# Patient Record
Sex: Female | Born: 1995 | Race: Black or African American | Hispanic: No | Marital: Single | Smoking: Former smoker
Health system: Southern US, Community
[De-identification: ages and names within clinical notes are randomized; demographics above are authoritative.]

## PROBLEM LIST (undated history)

## (undated) DIAGNOSIS — Z789 Other specified health status: Secondary | ICD-10-CM

## (undated) HISTORY — PX: NO PAST SURGERIES: SHX2092

---

## 2019-07-31 ENCOUNTER — Other Ambulatory Visit: Payer: Self-pay

## 2019-07-31 ENCOUNTER — Emergency Department (HOSPITAL_COMMUNITY)
Admission: EM | Admit: 2019-07-31 | Discharge: 2019-07-31 | Disposition: A | Discharge status: Home / Self Care | Payer: Medicaid Other | Attending: Emergency Medicine | Admitting: Emergency Medicine

## 2019-07-31 ENCOUNTER — Encounter (HOSPITAL_COMMUNITY): Payer: Self-pay | Admitting: Emergency Medicine

## 2019-07-31 ENCOUNTER — Emergency Department (HOSPITAL_COMMUNITY): Payer: Medicaid Other

## 2019-07-31 DIAGNOSIS — S9032XA Contusion of left foot, initial encounter: Secondary | ICD-10-CM | POA: Diagnosis not present

## 2019-07-31 DIAGNOSIS — Y998 Other external cause status: Secondary | ICD-10-CM | POA: Insufficient documentation

## 2019-07-31 DIAGNOSIS — Y9301 Activity, walking, marching and hiking: Secondary | ICD-10-CM | POA: Diagnosis not present

## 2019-07-31 DIAGNOSIS — X509XXA Other and unspecified overexertion or strenuous movements or postures, initial encounter: Secondary | ICD-10-CM | POA: Diagnosis not present

## 2019-07-31 DIAGNOSIS — Y92018 Other place in single-family (private) house as the place of occurrence of the external cause: Secondary | ICD-10-CM | POA: Insufficient documentation

## 2019-07-31 DIAGNOSIS — S99922A Unspecified injury of left foot, initial encounter: Secondary | ICD-10-CM | POA: Diagnosis present

## 2019-07-31 MED ORDER — IBUPROFEN 400 MG PO TABS
600.0000 mg | ORAL_TABLET | Freq: Once | ORAL | Status: AC
  Administered 2019-07-31: 600 mg via ORAL
  Filled 2019-07-31: qty 1

## 2019-07-31 MED ORDER — IBUPROFEN 600 MG PO TABS
600.0000 mg | ORAL_TABLET | Freq: Three times a day (TID) | ORAL | 0 refills | Status: DC | PRN
Start: 2019-07-31 — End: 2019-08-10

## 2019-07-31 MED ORDER — IBUPROFEN 600 MG PO TABS
600.0000 mg | ORAL_TABLET | Freq: Three times a day (TID) | ORAL | 0 refills | Status: DC | PRN
Start: 2019-07-31 — End: 2019-07-31

## 2019-07-31 NOTE — ED Notes (Signed)
Patient given discharge instructions. Questions were answered. Patient verbalized understanding of discharge instructions and care at home.  

## 2019-07-31 NOTE — ED Provider Notes (Addendum)
[facility] EMERGENCY DEPARTMENT Provider Note   CSN: [id number] Arrival date & time: 07/31/19  1453     History Chief Complaint  Patient presents with  . Foot Pain    Teresa Boyle is a 24 y.o. female.  The history is provided by the patient. No language interpreter was used.  Foot Pain   Teresa Boyle is a 24 y.o. female who presents to the Emergency Department complaining of foot injury. She presents the emergency department after injuring her left foot when she was walking down the stairs yesterday. She missed her stop and landed hard on her left foot. She complains of pain to the lateral aspect of her left distal foot. Pain is present at rest and significantly worse when she puts pressure on the outer portion of her foot. No prior similar symptoms. She has no medical problems and takes no medications. Symptoms are mild to moderate and constant nature.    History reviewed. No pertinent past medical history.  There are no problems to display for this patient.   History reviewed. No pertinent surgical history.     No family history on file.  Social History   Tobacco Use  . Smoking status: Never Smoker  Substance Use Topics  . Alcohol use: Yes  . Drug use: Never    Home Medications Prior to Admission medications   Not on File    Allergies    Patient has no known allergies.  Review of Systems   Review of Systems  All other systems reviewed and are negative.   Physical Exam Updated Vital Signs BP (!) 141/94 (BP Location: Right Arm)   Pulse 80   Temp 97.6 F (36.4 C) (Oral)   Resp 18   Ht 5\' 6"  (1.676 m)   Wt 86.2 kg   SpO2 100%   BMI 30.67 kg/m   Physical Exam Vitals and nursing note reviewed.  Constitutional:      Appearance: He is well-developed.  HENT:     Head: Normocephalic and atraumatic.  Cardiovascular:     Rate and Rhythm: Normal rate and regular rhythm.  Pulmonary:     Effort: Pulmonary effort is normal. No  respiratory distress.  Musculoskeletal:     Comments: 2+ DP pulses bilaterally. There is mild soft tissue swelling and tenderness to the left lateral foot. Flexion extension is intact the ankle. Range of motion intact in the toes.  Skin:    General: Skin is warm and dry.  Neurological:     Mental Status: He is alert and oriented to person, place, and time.  Psychiatric:        Behavior: Behavior normal.     ED Results / Procedures / Treatments   Labs (all labs ordered are listed, but only abnormal results are displayed) Labs Reviewed - No data to display  EKG None  Radiology DG Foot Complete Left  Result Date: 07/31/2019 CLINICAL DATA:  Tripped on stairs leading to fall. Pain and swelling of left foot. EXAM: LEFT FOOT - COMPLETE 3+ VIEW COMPARISON:  None. FINDINGS: There is no evidence of fracture or dislocation. There is no evidence of arthropathy or other focal bone abnormality. Mild generalized soft tissue edema. IMPRESSION: No fracture or subluxation of the left foot. Electronically Signed   By: Keith Rake M.D.   On: 07/31/2019 16:07    Procedures Procedures (including critical care time)  Medications Ordered in ED Medications  ibuprofen (ADVIL) tablet 600 mg (has no administration in time range)  ED Course  I have reviewed the triage vital signs and the nursing notes.  Pertinent labs & imaging results that were available during my care of the patient were reviewed by me and considered in my medical decision making (see chart for details).    MDM Rules/Calculators/A&P                     Patient here for evaluation of injury to the left foot. She does have local tenderness and swelling on examination. Imaging is negative for acute fracture. Presentation is not consistent with lisfranc injury, Jones fracture.  Plain films personally reviewed.  Discussed with patient home care for foot contusion/sprain. Will treat with postop shoe and crutches with weight-bearing  as tolerated. Recommend over-the-counter ibuprofen for pain control as well as elevation. Discussed return precautions.  Final Clinical Impression(s) / ED Diagnoses Final diagnoses:  Contusion of left foot, initial encounter    Rx / DC Orders ED Discharge Orders    None       Tilden Fossa, MD 07/31/19 1630    Tilden Fossa, MD 07/31/19 [phone]

## 2019-07-31 NOTE — ED Triage Notes (Signed)
Pt c/o left foot pain after a fall last night.

## 2019-07-31 NOTE — Progress Notes (Signed)
Orthopedic Tech Progress Note Patient Details:  Teresa Boyle 08-25-1995 [id number]  Ortho Devices Type of Ortho Device: Crutches, Postop shoe/boot Ortho Device/Splint Interventions: Application   Post Interventions Patient Tolerated: Well Instructions Provided: Adjustment of device, Care of device, Poper ambulation with device   Gwendolyn Lima 07/31/2019, 4:45 PM

## 2019-08-10 ENCOUNTER — Encounter (HOSPITAL_COMMUNITY): Payer: Self-pay | Admitting: *Deleted

## 2019-08-10 ENCOUNTER — Inpatient Hospital Stay (HOSPITAL_COMMUNITY)
Admission: AD | Admit: 2019-08-10 | Discharge: 2019-08-10 | Disposition: A | Discharge status: Home / Self Care | Payer: Medicaid Other | Attending: Obstetrics & Gynecology | Admitting: Obstetrics & Gynecology

## 2019-08-10 ENCOUNTER — Inpatient Hospital Stay (HOSPITAL_COMMUNITY): Payer: Medicaid Other

## 2019-08-10 ENCOUNTER — Ambulatory Visit (HOSPITAL_COMMUNITY)
Admission: EM | Admit: 2019-08-10 | Discharge: 2019-08-10 | Disposition: A | Discharge status: Home / Self Care | Payer: Medicaid Other

## 2019-08-10 DIAGNOSIS — O3680X0 Pregnancy with inconclusive fetal viability, not applicable or unspecified: Secondary | ICD-10-CM | POA: Diagnosis not present

## 2019-08-10 DIAGNOSIS — O26891 Other specified pregnancy related conditions, first trimester: Secondary | ICD-10-CM | POA: Diagnosis present

## 2019-08-10 DIAGNOSIS — R1030 Lower abdominal pain, unspecified: Secondary | ICD-10-CM | POA: Diagnosis not present

## 2019-08-10 DIAGNOSIS — Z3A01 Less than 8 weeks gestation of pregnancy: Secondary | ICD-10-CM | POA: Diagnosis not present

## 2019-08-10 DIAGNOSIS — R109 Unspecified abdominal pain: Secondary | ICD-10-CM

## 2019-08-10 HISTORY — DX: Other specified health status: Z78.9

## 2019-08-10 LAB — WET PREP, GENITAL
Sperm: NONE SEEN
Trich, Wet Prep: NONE SEEN
Yeast Wet Prep HPF POC: NONE SEEN

## 2019-08-10 LAB — URINALYSIS, ROUTINE W REFLEX MICROSCOPIC
Bilirubin Urine: NEGATIVE
Glucose, UA: NEGATIVE mg/dL
Hgb urine dipstick: NEGATIVE
Ketones, ur: NEGATIVE mg/dL
Leukocytes,Ua: NEGATIVE
Nitrite: NEGATIVE
Protein, ur: NEGATIVE mg/dL
Specific Gravity, Urine: 1.026 (ref 1.005–1.030)
pH: 5 (ref 5.0–8.0)

## 2019-08-10 LAB — CBC
HCT: 35.3 % — ABNORMAL LOW (ref 36.0–46.0)
Hemoglobin: 11.2 g/dL — ABNORMAL LOW (ref 12.0–15.0)
MCH: 22.3 pg — ABNORMAL LOW (ref 26.0–34.0)
MCHC: 31.7 g/dL (ref 30.0–36.0)
MCV: 70.3 fL — ABNORMAL LOW (ref 80.0–100.0)
Platelets: 368 10*3/uL (ref 150–400)
RBC: 5.02 MIL/uL (ref 3.87–5.11)
RDW: 15.3 % (ref 11.5–15.5)
WBC: 7.3 10*3/uL (ref 4.0–10.5)
nRBC: 0 % (ref 0.0–0.2)

## 2019-08-10 LAB — POCT PREGNANCY, URINE: Preg Test, Ur: POSITIVE — AB

## 2019-08-10 LAB — ABO/RH: ABO/RH(D): O POS

## 2019-08-10 LAB — HCG, QUANTITATIVE, PREGNANCY: hCG, Beta Chain, Quant, S: 1413 m[IU]/mL — ABNORMAL HIGH (ref <5)

## 2019-08-10 NOTE — MAU Note (Signed)
Did home upt and it came back positive. Has occ abd cramping but no pain now. No bleeding or vag d/c. Want to be sure I am really pregnant and how far along I am.

## 2019-08-10 NOTE — Progress Notes (Signed)
Discussed pt's Triage information with V SMith CNM. Pt sitting in Triage for now where provider will see her and discuss POC

## 2019-08-10 NOTE — MAU Note (Signed)
Judeth Horn NP in Triage to see pt

## 2019-08-10 NOTE — Progress Notes (Signed)
Judeth Horn NP in earlier to discuss test results and d/c plan. Written and verbal d/c instructions given and understanding voiced. Will return to MAU Sat for repeat BHCG

## 2019-08-10 NOTE — MAU Provider Note (Signed)
Chief Complaint: Abdominal Pain   First Provider Initiated Contact with Patient 08/10/19 1951     SUBJECTIVE HPI: Teresa Boyle is a 24 y.o. G1P0 at [redacted]w[redacted]d who presents to Maternity Admissions reporting abdominal cramping.  Symptoms started 4 days ago. Reports mid lower abdominal cramping that comes & goes. Denies n/v/d, dysuria, vaginal bleeding, or vaginal discharge. Had a positive pregnancy test yesterday.   Location: mid lower abdomen Quality: intermittent Severity: currently none/10 on pain scale Duration: 4 days Timing: intermittent Modifying factors: none Associated signs and symptoms: none  Past Medical History:  Diagnosis Date  . Medical history non-contributory    OB History  Gravida Para Term Preterm AB Living  1            SAB TAB Ectopic Multiple Live Births               # Outcome Date GA Lbr Len/2nd Weight Sex Delivery Anes PTL Lv  1 Current            Past Surgical History:  Procedure Laterality Date  . NO PAST SURGERIES     Social History   Socioeconomic History  . Marital status: Single    Spouse name: Not on file  . Number of children: Not on file  . Years of education: Not on file  . Highest education level: Not on file  Occupational History  . Not on file  Tobacco Use  . Smoking status: Never Smoker  . Smokeless tobacco: Never Used  Substance and Sexual Activity  . Alcohol use: Yes    Comment: on occ  . Drug use: Never  . Sexual activity: Yes  Other Topics Concern  . Not on file  Social History Narrative  . Not on file   Social Determinants of Health   Financial Resource Strain:   . Difficulty of Paying Living Expenses:   Food Insecurity:   . Worried About Programme researcher, broadcasting/film/video in the Last Year:   . Barista in the Last Year:   Transportation Needs:   . Freight forwarder (Medical):   Marland Kitchen Lack of Transportation (Non-Medical):   Physical Activity:   . Days of Exercise per Week:   . Minutes of Exercise per Session:    Stress:   . Feeling of Stress :   Social Connections:   . Frequency of Communication with Friends and Family:   . Frequency of Social Gatherings with Friends and Family:   . Attends Religious Services:   . Active Member of Clubs or Organizations:   . Attends Banker Meetings:   Marland Kitchen Marital Status:   Intimate Partner Violence:   . Fear of Current or Ex-Partner:   . Emotionally Abused:   Marland Kitchen Physically Abused:   . Sexually Abused:    Family History  Problem Relation Age of Onset  . Diabetes Father   . Diabetes Paternal Uncle   . Diabetes Paternal Grandmother   . Diabetes Paternal Grandfather    No current facility-administered medications on file prior to encounter.   Current Outpatient Medications on File Prior to Encounter  Medication Sig Dispense Refill  . ibuprofen (ADVIL) 600 MG tablet Take 1 tablet (600 mg total) by mouth every 8 (eight) hours as needed. 20 tablet 0   No Known Allergies  I have reviewed patient's Past Medical Hx, Surgical Hx, Family Hx, Social Hx, medications and allergies.   Review of Systems  Constitutional: Negative.   Gastrointestinal: Positive for abdominal pain.  Negative for constipation, diarrhea, nausea and vomiting.  Genitourinary: Negative.     OBJECTIVE Patient Vitals for the past 24 hrs:  BP Temp Pulse Resp Height Weight  08/10/19 2138 133/85 -- 72 16 -- --  08/10/19 1915 126/79 98.7 F (37.1 C) 63 18 5\' 6"  (1.676 m) 90.3 kg   Constitutional: Well-developed, well-nourished female in no acute distress.  Cardiovascular: normal rate & rhythm, no murmur Respiratory: normal rate and effort. Lung sounds clear throughout GI: Abd soft, non-tender, Pos BS x 4. No guarding or rebound tenderness MS: Extremities nontender, no edema, normal ROM Neurologic: Alert and oriented x 4.      LAB RESULTS Results for orders placed or performed during the hospital encounter of 08/10/19 (from the past 24 hour(s))  Pregnancy, urine POC      Status: Abnormal   Collection Time: 08/10/19  6:50 PM  Result Value Ref Range   Preg Test, Ur POSITIVE (A) NEGATIVE  Urinalysis, Routine w reflex microscopic     Status: None   Collection Time: 08/10/19  6:56 PM  Result Value Ref Range   Color, Urine YELLOW YELLOW   APPearance CLEAR CLEAR   Specific Gravity, Urine 1.026 1.005 - 1.030   pH 5.0 5.0 - 8.0   Glucose, UA NEGATIVE NEGATIVE mg/dL   Hgb urine dipstick NEGATIVE NEGATIVE   Bilirubin Urine NEGATIVE NEGATIVE   Ketones, ur NEGATIVE NEGATIVE mg/dL   Protein, ur NEGATIVE NEGATIVE mg/dL   Nitrite NEGATIVE NEGATIVE   Leukocytes,Ua NEGATIVE NEGATIVE  CBC     Status: Abnormal   Collection Time: 08/10/19  8:04 PM  Result Value Ref Range   WBC 7.3 4.0 - 10.5 K/uL   RBC 5.02 3.87 - 5.11 MIL/uL   Hemoglobin 11.2 (L) 12.0 - 15.0 g/dL   HCT 08/12/19 (L) 71.6 - 96.7 %   MCV 70.3 (L) 80.0 - 100.0 fL   MCH 22.3 (L) 26.0 - 34.0 pg   MCHC 31.7 30.0 - 36.0 g/dL   RDW 89.3 81.0 - 17.5 %   Platelets 368 150 - 400 K/uL   nRBC 0.0 0.0 - 0.2 %  ABO/Rh     Status: None   Collection Time: 08/10/19  8:04 PM  Result Value Ref Range   ABO/RH(D) O POS    No rh immune globuloin      NOT A RH IMMUNE GLOBULIN CANDIDATE, PT RH POSITIVE Performed at [facility], [address], [place], [place]   hCG, quantitative, pregnancy     Status: Abnormal   Collection Time: 08/10/19  8:04 PM  Result Value Ref Range   hCG, Beta Chain, Quant, S 1,413 (H) <5 mIU/mL  Wet prep, genital     Status: Abnormal   Collection Time: 08/10/19  8:46 PM   Specimen: PATH Cytology Cervicovaginal Ancillary Only  Result Value Ref Range   Yeast Wet Prep HPF POC NONE SEEN NONE SEEN   Trich, Wet Prep NONE SEEN NONE SEEN   Clue Cells Wet Prep HPF POC PRESENT (A) NONE SEEN   WBC, Wet Prep HPF POC MODERATE (A) NONE SEEN   Sperm NONE SEEN     IMAGING 08/12/19 OB LESS THAN 14 WEEKS WITH OB TRANSVAGINAL  Result Date: 08/10/2019 CLINICAL DATA:  Initial evaluation for  acute abdominal pain, positive urine pregnancy test. EXAM: OBSTETRIC <14 WK 08/12/2019 AND TRANSVAGINAL OB US TECHNIQUE: Both transabdominal and transvaginal ultrasound examinations were performed for complete evaluation of the gestation as well as the maternal uterus,  adnexal regions, and pelvic cul-de-sac. Transvaginal technique was performed to assess early pregnancy. COMPARISON:  None. FINDINGS: Intrauterine gestational sac: Negative. Endometrial stripe measures 10 mm in thickness. Yolk sac:  Negative. Embryo:  Negative. Cardiac Activity: Negative. Heart Rate: N/A  bpm Subchorionic hemorrhage:  None visualized. Maternal uterus/adnexae: Ovaries are within normal limits bilaterally. Degenerating corpus luteal cyst noted within the left ovary. No adnexal mass or free fluid. IMPRESSION: 1. Early pregnancy with no discrete IUP or adnexal mass identified. Finding is consistent with a pregnancy of unknown anatomic location. Differential considerations include IUP to early to visualize, recent SAB, or possibly occult ectopic pregnancy. Close clinical monitoring with serial beta HCGs and close interval follow-up ultrasound recommended as clinically warranted. 2. No other acute maternal uterine or adnexal abnormality identified. Electronically Signed   By: Jeannine Boga M.D.   On: 08/10/2019 20:57    MAU COURSE Orders Placed This Encounter  Procedures  . Wet prep, genital  . US OB LESS THAN 14 WEEKS WITH OB TRANSVAGINAL  . Urinalysis, Routine w reflex microscopic  . CBC  . hCG, quantitative, pregnancy  . Pregnancy, urine POC  . ABO/Rh  . Discharge patient   No orders of the defined types were placed in this encounter.   MDM +UPT UA, wet prep, GC/chlamydia, CBC, ABO/Rh, quant hCG, and [place] today to rule out ectopic pregnancy which can be life threatening.   Ultrasound shows no IUP or adnexal masses.  hCG today is 1400.  Will bring back Saturday for stat hCG.  Cannot exclude ectopic pregnancy at this  time.  ASSESSMENT 1. Pregnancy of unknown anatomic location   2. Abdominal pain during pregnancy in first trimester     PLAN Discharge home in stable condition. Return to MAU on Saturday for stat HCG GC/CT pending Reviewed ectopic precautions & reasons to return to MAU   Follow-up Information    [facility] Follow up.   Specialty: Obstetrics and Gynecology Why: return saturday for blood work or sooner for worsening symptoms Contact information: [address] [id number] [place] [phone]         Allergies as of 08/10/2019   No Known Allergies     Medication List    STOP taking these medications   ibuprofen 600 MG tablet Commonly known as: Orest Dikes, NP 08/10/2019  9:51 PM

## 2019-08-10 NOTE — Discharge Instructions (Signed)
Return to care  If you have heavier bleeding that soaks through more that 2 pads per hour for an hour or more If you bleed so much that you feel like you might pass out or you do pass out If you have significant abdominal pain that is not improved with Tylenol   

## 2019-08-12 LAB — GC/CHLAMYDIA PROBE AMP (~~LOC~~) NOT AT ARMC
Chlamydia: NEGATIVE
Comment: NEGATIVE
Comment: NORMAL
Neisseria Gonorrhea: NEGATIVE

## 2019-08-13 ENCOUNTER — Inpatient Hospital Stay (HOSPITAL_COMMUNITY)
Admission: AD | Admit: 2019-08-13 | Discharge: 2019-08-13 | Disposition: A | Discharge status: Home / Self Care | Payer: Medicaid Other | Source: Ambulatory Visit | Attending: Obstetrics and Gynecology | Admitting: Obstetrics and Gynecology

## 2019-08-13 ENCOUNTER — Other Ambulatory Visit: Payer: Self-pay

## 2019-08-13 DIAGNOSIS — O3680X0 Pregnancy with inconclusive fetal viability, not applicable or unspecified: Secondary | ICD-10-CM | POA: Diagnosis present

## 2019-08-13 DIAGNOSIS — Z3A01 Less than 8 weeks gestation of pregnancy: Secondary | ICD-10-CM | POA: Insufficient documentation

## 2019-08-13 LAB — HCG, QUANTITATIVE, PREGNANCY: hCG, Beta Chain, Quant, S: 4800 m[IU]/mL — ABNORMAL HIGH (ref <5)

## 2019-08-13 NOTE — MAU Note (Signed)
Teresa Boyle is a 24 y.o. at [redacted]w[redacted]d here in MAU reporting: here for repeat hcg. No bleeding. Having some pain, states it is the same.  Pain score: 7/10  Vitals:   08/13/19 1448  BP: 131/84  Pulse: 72  Resp: 17  Temp: 99.7 F (37.6 C)  SpO2: 99%     Lab orders placed from triage: hcg

## 2019-08-13 NOTE — MAU Provider Note (Signed)
Teresa Boyle  is a 24 y.o. G1P0 at [redacted]w[redacted]d who presents to MAU today for follow-up quant hCG after 48 hours. The patient was seen in MAU on 08/10/19 and had quant hCG of 1413 and US showed no IUP or adnexal mass. She denies vaginal bleeding or fever today. Continue to have mild LAP, feels like cramps. Has not tried anything for it.  OB History  Gravida Para Term Preterm AB Living  1            SAB TAB Ectopic Multiple Live Births               # Outcome Date GA Lbr Len/2nd Weight Sex Delivery Anes PTL Lv  1 Current             Past Medical History:  Diagnosis Date  . Medical history non-contributory    ROS: no VB + pain  BP 131/84 (BP Location: Right Arm)   Pulse 72   Temp 99.7 F (37.6 C) (Oral)   Resp 17   Ht 5\' 6"  (1.676 m)   Wt 89.2 kg   LMP 07/01/2019   SpO2 99% Comment: room air  BMI 31.75 kg/m   CONSTITUTIONAL: Well-developed, well-nourished female in no acute distress.  MUSCULOSKELETAL: Normal range of motion.  CARDIOVASCULAR: Regular heart rate RESPIRATORY: Normal effort NEUROLOGICAL: Alert and oriented to person, place, and time.  SKIN: Not diaphoretic. No erythema. No pallor. PSYCH: Normal mood and affect. Normal behavior. Normal judgment and thought content.  Results for orders placed or performed during the hospital encounter of 08/13/19 (from the past 24 hour(s))  hCG, quantitative, pregnancy     Status: Abnormal   Collection Time: 08/13/19  2:53 PM  Result Value Ref Range   hCG, Beta Chain, Quant, S 4,800 (H) <5 mIU/mL   MDM: Labs ordered and reviewed. Good rise in qhcg. 1640: Notified pt by phone of results and recommend rpt 08/15/19 in 10 days to confirm viability. Pt states she plans to terminate the pregnancy and asks if this will affect anything. Informed pt that an IUP will need to be confirmed before she can terminate the pregnancy and pt agrees to come for scan. Outpt [place] ordered.  A: 1. Pregnancy, location unknown   2. Pregnancy with inconclusive  fetal viability, single or unspecified fetus    P: Discharge home First trimester/ectopic precautions discussed Patient will follow up in 10 days at [facility] Patient may return to MAU as needed or if her condition were to change or worsen   Allergies as of 08/13/2019   No Known Allergies     Medication List    You have not been prescribed any medications.    08/15/2019, CNM 08/13/2019 4:57 PM

## 2019-08-22 ENCOUNTER — Other Ambulatory Visit: Payer: Self-pay

## 2019-08-22 ENCOUNTER — Inpatient Hospital Stay (HOSPITAL_COMMUNITY)
Admission: AD | Admit: 2019-08-22 | Discharge: 2019-08-22 | Disposition: A | Discharge status: Home / Self Care | Payer: Medicaid Other | Attending: Obstetrics and Gynecology | Admitting: Obstetrics and Gynecology

## 2019-08-22 DIAGNOSIS — O3680X0 Pregnancy with inconclusive fetal viability, not applicable or unspecified: Secondary | ICD-10-CM | POA: Insufficient documentation

## 2019-08-22 DIAGNOSIS — Z3A01 Less than 8 weeks gestation of pregnancy: Secondary | ICD-10-CM | POA: Insufficient documentation

## 2019-08-22 NOTE — MAU Note (Signed)
Pt was to return in 10days for [place]. Did not receive call with appt. Denies any problems today.  No bleeding, has had an occasional mild cramp.

## 2019-08-22 NOTE — MAU Provider Note (Signed)
First Provider Initiated Contact with Patient 08/22/19 1339      S Ms. Teresa Boyle is a 24 y.o. G1P0 pregnant female who presents to MAU today with complaints of "needing follow-up". She is without new complaints.  She was seen on May 29 and diagnosed with pregnancy of unknown location, she was told she would need follow-up ultrasound in 10 days for repeat ultrasound.  She states nobody called her to schedule.  She has no pain no bleeding, she plans to terminate this pregnancy.  O BP 123/70 (BP Location: Right Arm)   Pulse 69   Temp 98.5 F (36.9 C) (Oral)   Resp 18   LMP 07/01/2019   SpO2 100%  Physical Exam  Constitutional: She is oriented to person, place, and time. She appears well-developed and well-nourished. No distress.  Neurological: She is alert and oriented to person, place, and time.  Skin: She is not diaphoretic.    A pregnant female Medical screening exam complete Pregnancy of unknown location   P Discharge from MAU in stable condition [place] scheduled outpatient for Wed 6/9 @ 1500 Return to MAU if symptoms worsen   Fleurette Woolbright, Harolyn Rutherford, NP 08/22/2019 1:42 PM

## 2019-08-24 ENCOUNTER — Ambulatory Visit (INDEPENDENT_AMBULATORY_CARE_PROVIDER_SITE_OTHER): Payer: Self-pay | Admitting: General Practice

## 2019-08-24 ENCOUNTER — Other Ambulatory Visit: Payer: Self-pay

## 2019-08-24 ENCOUNTER — Encounter: Payer: Self-pay | Admitting: Family Medicine

## 2019-08-24 ENCOUNTER — Ambulatory Visit
Admit: 2019-08-24 | Discharge: 2019-08-24 | Disposition: A | Discharge status: Home / Self Care | Payer: Medicaid Other | Attending: Certified Nurse Midwife | Admitting: Certified Nurse Midwife

## 2019-08-24 DIAGNOSIS — Z712 Person consulting for explanation of examination or test findings: Secondary | ICD-10-CM

## 2019-08-24 DIAGNOSIS — Z3A01 Less than 8 weeks gestation of pregnancy: Secondary | ICD-10-CM

## 2019-08-24 DIAGNOSIS — O3680X0 Pregnancy with inconclusive fetal viability, not applicable or unspecified: Secondary | ICD-10-CM

## 2019-08-24 MED ORDER — PRENATAL PLUS 27-1 MG PO TABS: 1.0000 | ORAL_TABLET | Freq: Every day | ORAL | 0 refills | Status: DC

## 2019-08-24 NOTE — Progress Notes (Signed)
Patient presents to office today for viability ultrasound results. Reviewed results with Dr Vergie Living who finds single living IUP- patient should begin prenatal care.   Informed patient of results, reviewed dating, & provided pictures. Rx for PNV sent to pharmacy. [facility]   Chase Caller RN BSN 08/24/19

## 2019-08-29 NOTE — Progress Notes (Signed)
Patient was assessed and managed by nursing staff during this encounter. I have reviewed the chart and agree with the documentation and plan. I have also made any necessary editorial changes.  Ranata Laughery, MD 08/29/2019 2:11 PM   

## 2019-11-17 ENCOUNTER — Other Ambulatory Visit: Payer: Self-pay

## 2020-10-10 ENCOUNTER — Ambulatory Visit (HOSPITAL_COMMUNITY): Payer: Self-pay

## 2021-07-07 IMAGING — CR DG FOOT COMPLETE 3+V*L*
3 series · 3 of 3 positions shown · non-contrast
Comparison: None.

CLINICAL DATA: Tripped on stairs leading to fall. Pain and swelling
of left foot.

EXAM:
LEFT FOOT - COMPLETE 3+ VIEW

[foot ap]
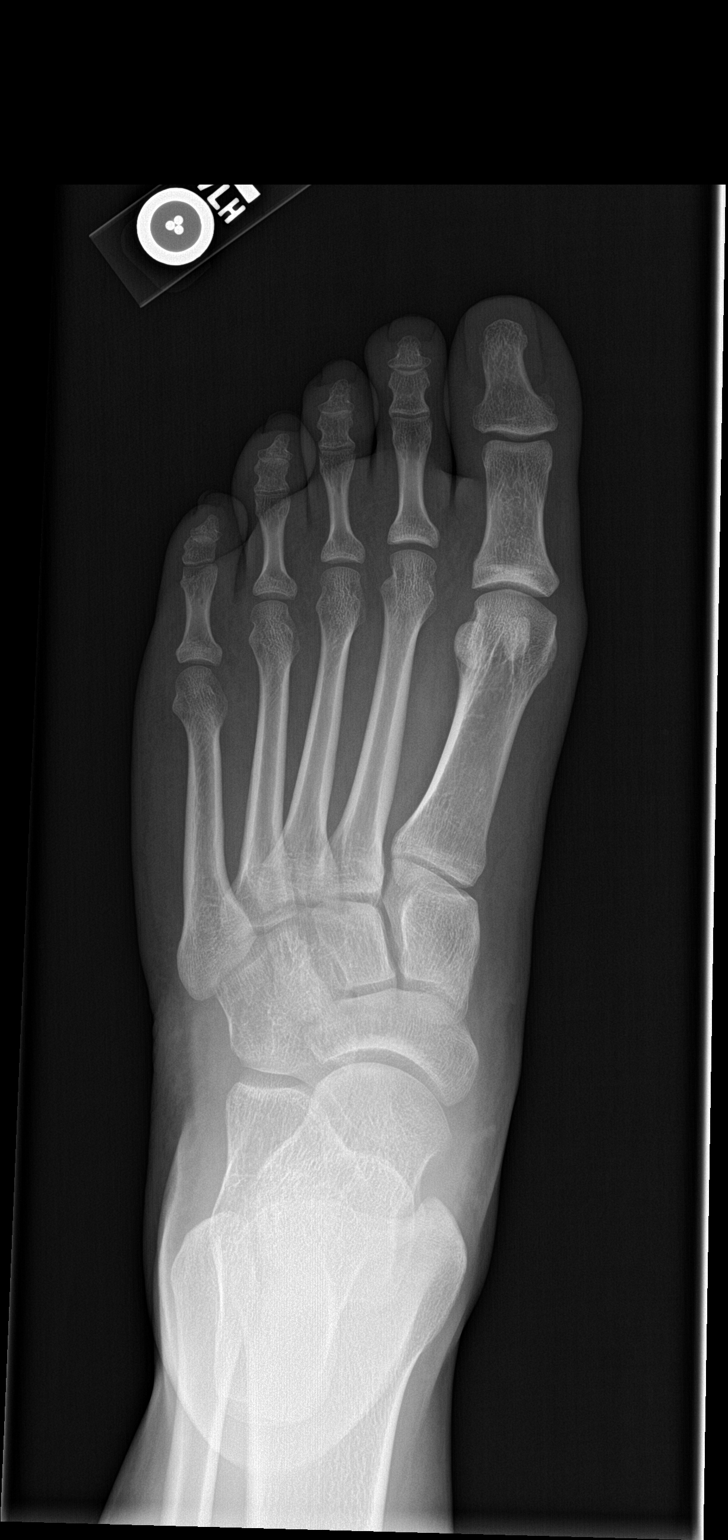

[foot obl]
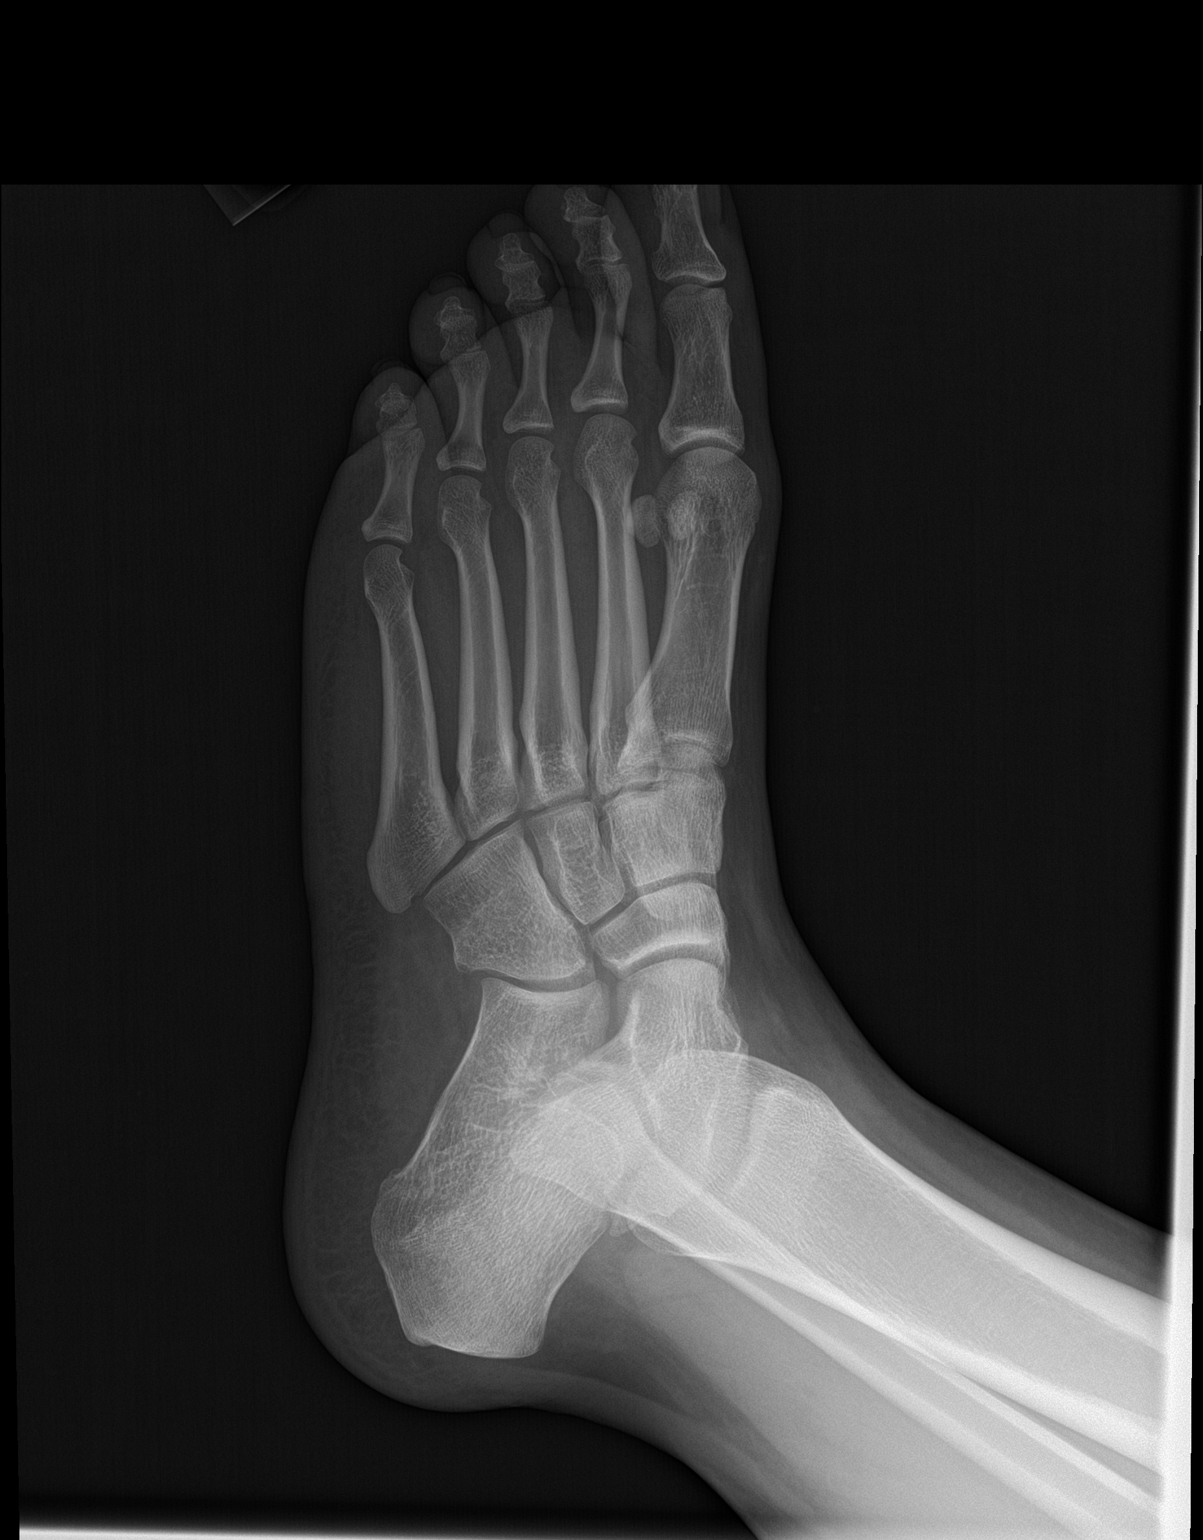

[foot lat]
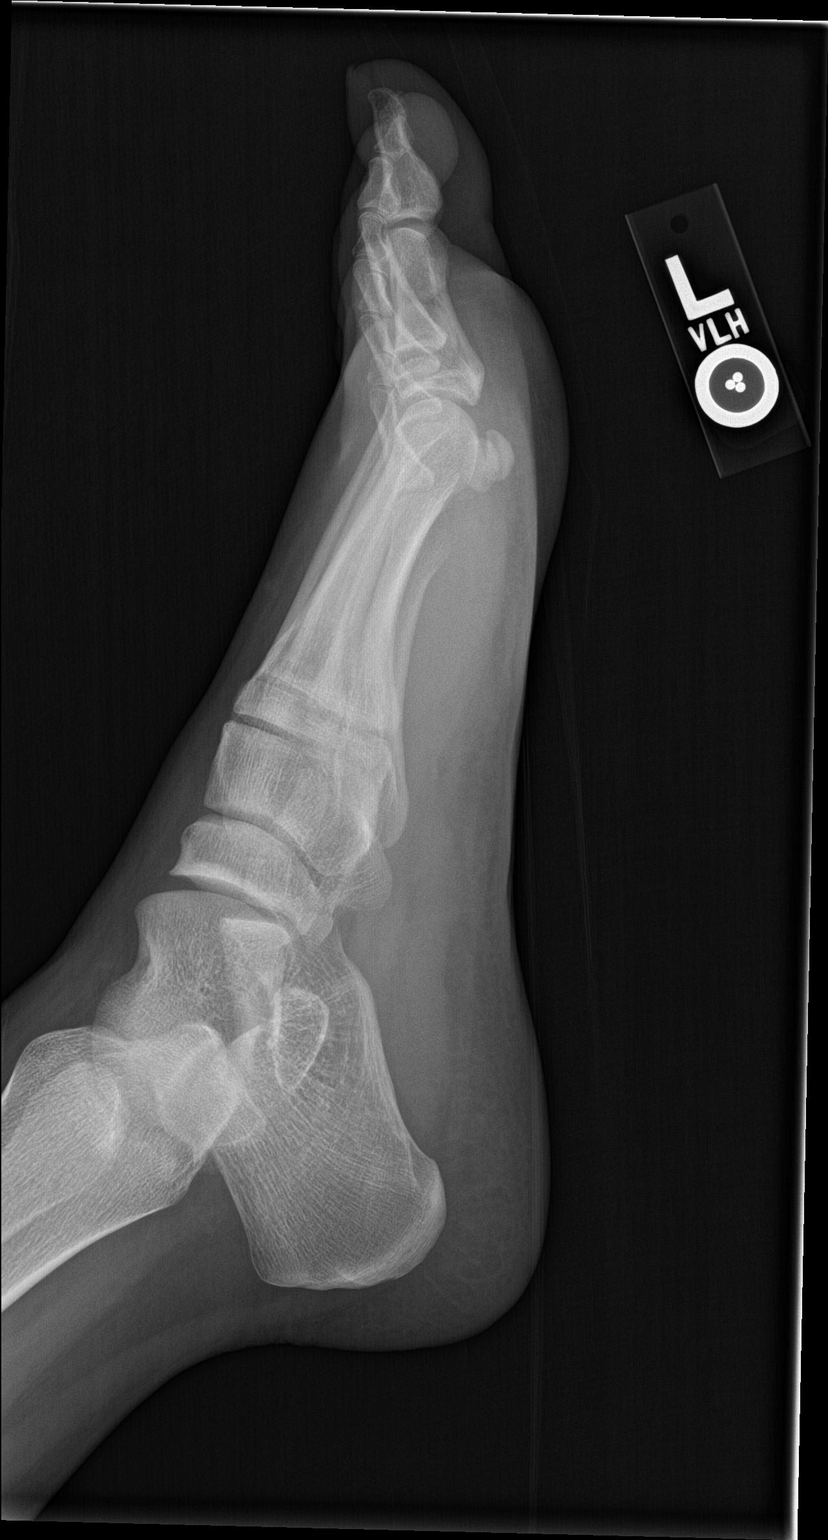

[3 of 3 positions shown; findings below may reference images not displayed]

FINDINGS: There is no evidence of fracture or dislocation. There is no
evidence of arthropathy or other focal bone abnormality. Mild
generalized soft tissue edema.
IMPRESSION: No fracture or subluxation of the left foot.

## 2021-07-31 IMAGING — US US OB TRANSVAGINAL
1 series · 15 of 28 positions shown · non-contrast
Comparison: 08/10/2019

CLINICAL DATA: Inconclusive fetal viability.

EXAM:
TRANSVAGINAL OB ULTRASOUND
TECHNIQUE: Transvaginal ultrasound was performed for complete evaluation of the
gestation as well as the maternal uterus, adnexal regions, and
pelvic cul-de-sac.

[Series 1: us ob transvaginal · 15 of 45 slices shown]
[im 1/45]
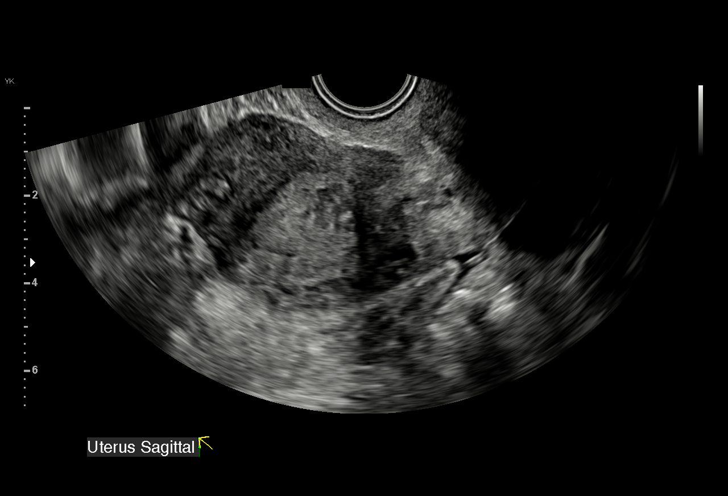
[im 4/45]
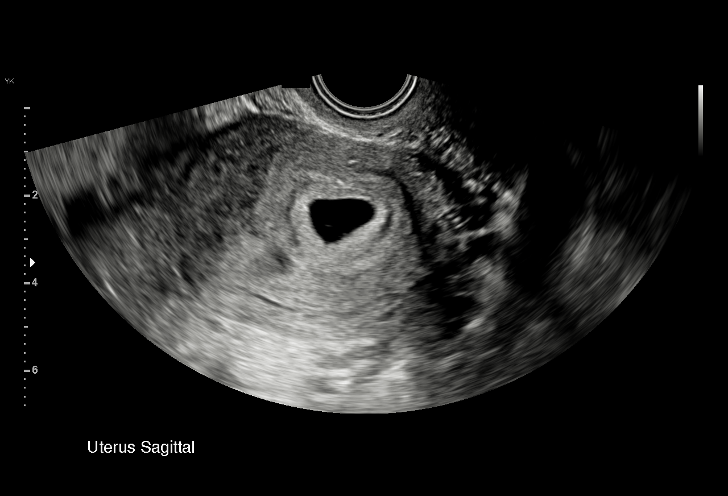
[im 7/45]
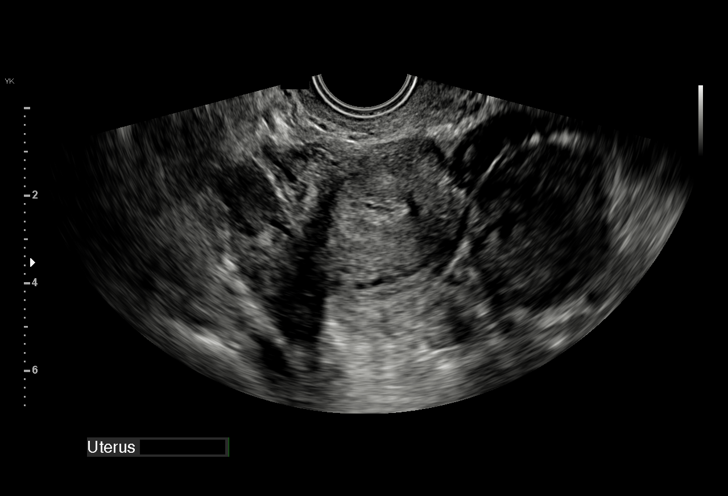
[im 10/45]
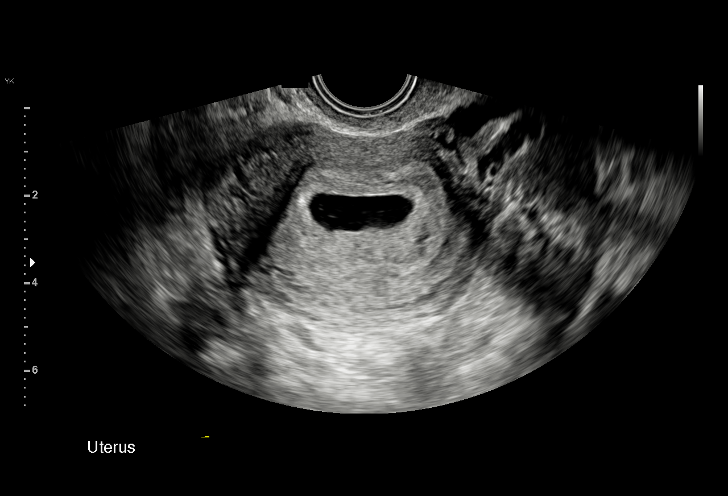
[im 14/45]
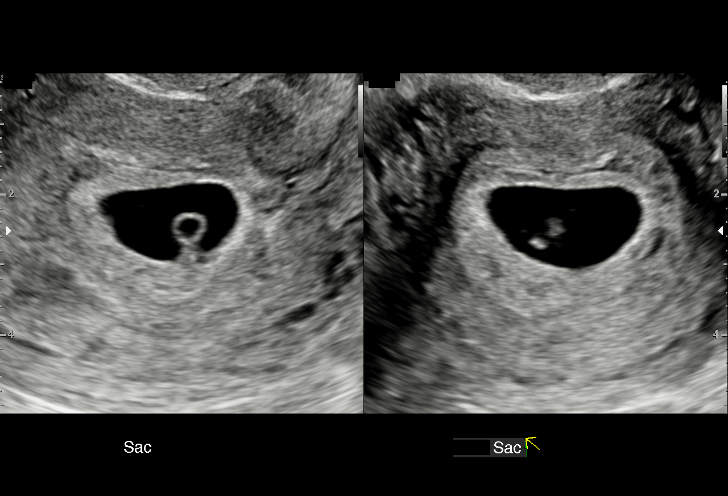
[im 17/45]
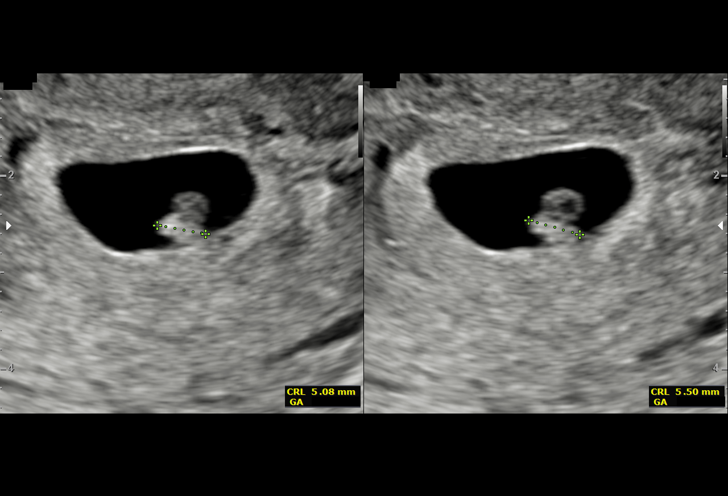
[im 20/45]
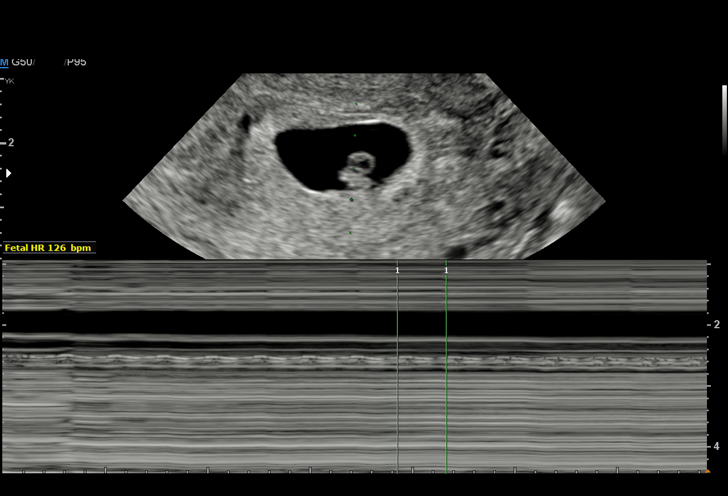
[im 23/45]
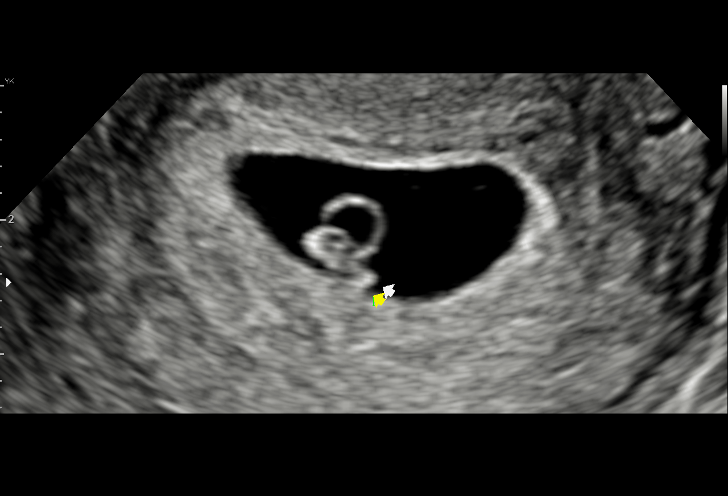
[im 25/45]
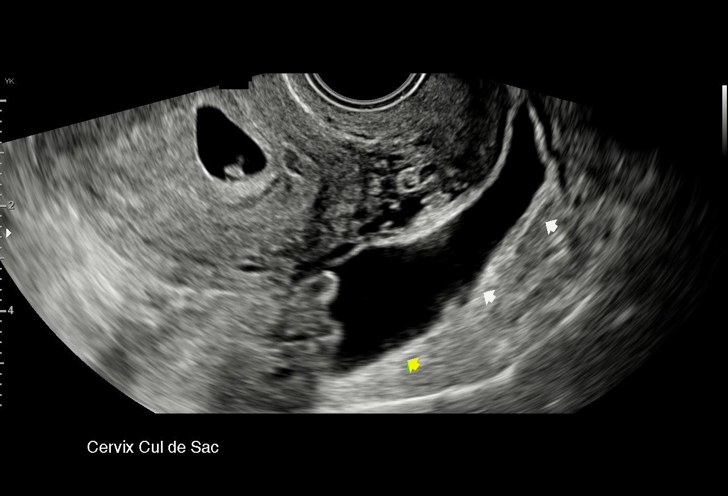
[im 28/45]
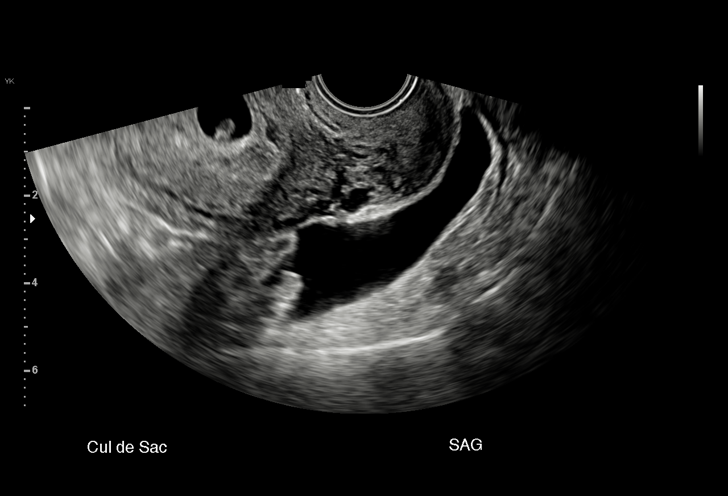
[im 31/45]
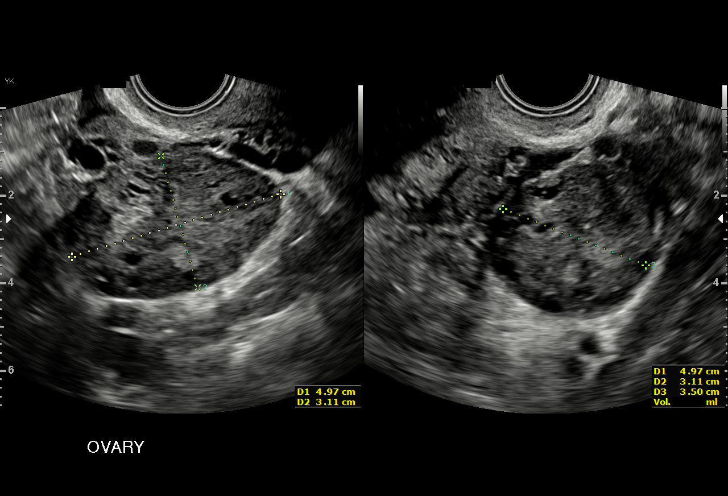
[im 35/45]
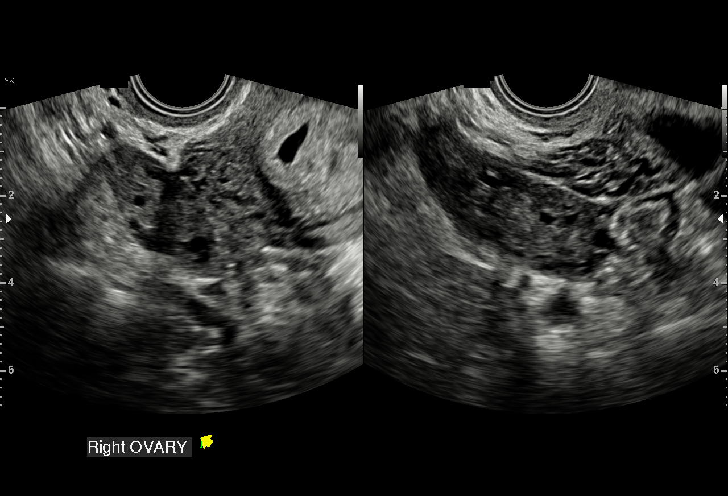
[im 38/45]
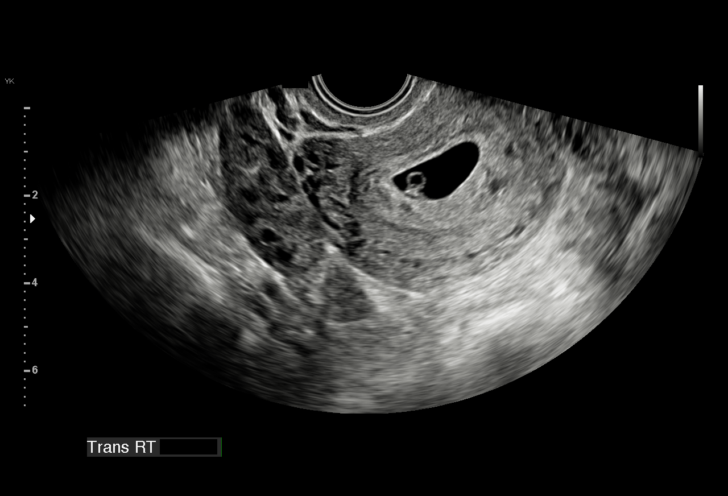
[im 41/45]
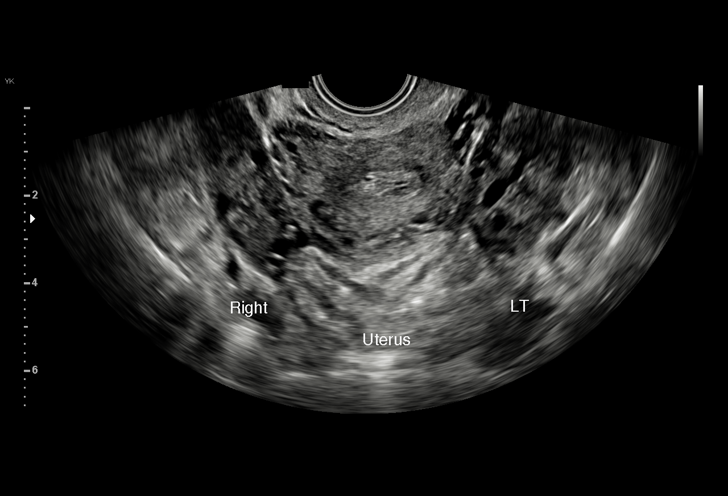
[im 45/45]
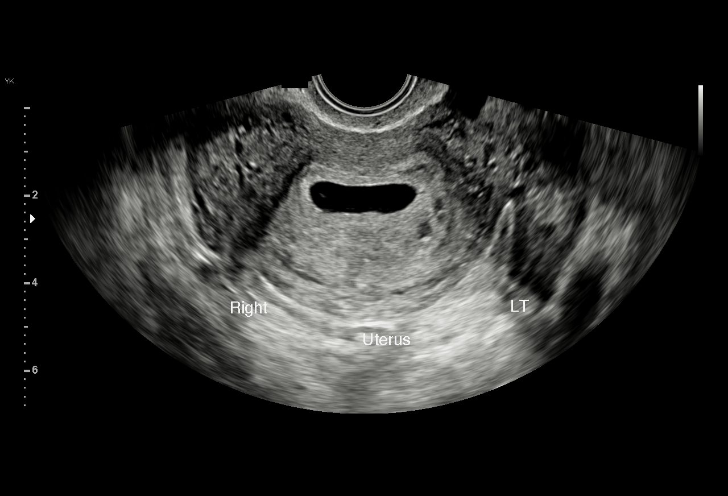

[15 of 28 positions shown; findings below may reference images not displayed]

FINDINGS: Intrauterine gestational sac: Present

Yolk sac:  Present

Embryo:  Present

Cardiac Activity: Present

Heart Rate: 124 bpm

MSD: 17.3 mm   6 w   4 d

CRL:   5.5 mm   6 w 2 d                  US EDC: 04/16/2020

Subchorionic hemorrhage:  None visualized.

Maternal uterus/adnexae: Both ovaries are normal. Small to moderate
free pelvic fluid.
IMPRESSION: 1. Single living intrauterine embryo estimated at 6 weeks and 2 days
gestation.
2. No subchorionic hemorrhage.
3. Normal ovaries.
4. Small to moderate free pelvic fluid.

## 2022-05-07 ENCOUNTER — Ambulatory Visit: Payer: Medicaid Other

## 2022-05-14 ENCOUNTER — Ambulatory Visit (INDEPENDENT_AMBULATORY_CARE_PROVIDER_SITE_OTHER): Payer: Medicaid Other

## 2022-05-14 ENCOUNTER — Other Ambulatory Visit (HOSPITAL_COMMUNITY)
Admission: RE | Admit: 2022-05-14 | Discharge: 2022-05-14 | Disposition: A | Discharge status: Home / Self Care | Payer: 59 | Source: Ambulatory Visit | Attending: Family Medicine | Admitting: Family Medicine

## 2022-05-14 DIAGNOSIS — N898 Other specified noninflammatory disorders of vagina: Secondary | ICD-10-CM | POA: Insufficient documentation

## 2022-05-14 MED ORDER — METRONIDAZOLE 500 MG PO TABS: 500.0000 mg | ORAL_TABLET | Freq: Two times a day (BID) | ORAL | 0 refills | Status: DC

## 2022-05-14 NOTE — Progress Notes (Signed)
Patient presents today for self swab. Pt states she has been experiencing yellow vaginal discharge with odor for about a week. Patient states she has had BV in the past with similar symptoms. Flagyl sent to patient pharmacy per protocol. Patient educated in how to perform self swab. Swab obtained. Pt informed she would be notified of results via MyChart in 24-48 hours. Patient verbalized understanding and denied further questions.

## 2022-05-15 LAB — CERVICOVAGINAL ANCILLARY ONLY
Bacterial Vaginitis (gardnerella): POSITIVE — AB
Candida Glabrata: NEGATIVE
Candida Vaginitis: NEGATIVE
Chlamydia: NEGATIVE
Comment: NEGATIVE
Comment: NEGATIVE
Comment: NEGATIVE
Comment: NEGATIVE
Comment: NEGATIVE
Comment: NORMAL
Neisseria Gonorrhea: NEGATIVE
Trichomonas: NEGATIVE

## 2022-06-06 ENCOUNTER — Telehealth: Payer: 59 | Admitting: Nurse Practitioner

## 2022-06-06 DIAGNOSIS — N3000 Acute cystitis without hematuria: Secondary | ICD-10-CM | POA: Diagnosis not present

## 2022-06-06 MED ORDER — NITROFURANTOIN MONOHYD MACRO 100 MG PO CAPS: 100.0000 mg | ORAL_CAPSULE | Freq: Two times a day (BID) | ORAL | 0 refills | Status: AC

## 2022-06-06 NOTE — Progress Notes (Signed)

## 2022-06-18 ENCOUNTER — Ambulatory Visit: Payer: 59 | Admitting: Obstetrics and Gynecology

## 2022-07-15 ENCOUNTER — Encounter: Payer: Self-pay | Admitting: Obstetrics and Gynecology

## 2022-07-15 ENCOUNTER — Ambulatory Visit (INDEPENDENT_AMBULATORY_CARE_PROVIDER_SITE_OTHER): Payer: 59 | Admitting: Obstetrics and Gynecology

## 2022-07-15 ENCOUNTER — Other Ambulatory Visit (HOSPITAL_COMMUNITY)
Admission: RE | Admit: 2022-07-15 | Discharge: 2022-07-15 | Disposition: A | Discharge status: Home / Self Care | Payer: 59 | Source: Ambulatory Visit | Attending: Obstetrics and Gynecology | Admitting: Obstetrics and Gynecology

## 2022-07-15 VITALS — BP 119/80 | HR 59 | Wt 217.6 lb

## 2022-07-15 DIAGNOSIS — Z124 Encounter for screening for malignant neoplasm of cervix: Secondary | ICD-10-CM

## 2022-07-15 DIAGNOSIS — Z0000 Encounter for general adult medical examination without abnormal findings: Secondary | ICD-10-CM

## 2022-07-15 NOTE — Progress Notes (Signed)
ANNUAL EXAM Patient name: Teresa Boyle MRN [id number]  Date of birth: [birth year] Chief Complaint:   Gynecologic Exam  History of Present Illness:   Teresa Boyle is a 27 y.o. G1P0 being seen today for a routine annual exam.  Current complaints: annual   Menstrual concerns? No   Breast or nipple changes? No  Contraception use? No  Sexually active? Yes   Patient's last menstrual period was 06/13/2022.  The pregnancy intention screening data noted above was reviewed. Potential methods of contraception were discussed. The patient elected to proceed with  no contraception.   Last pap No results found for: "DIAGPAP", "HPVHIGH", "ADEQPAP"  Last mammogram: n/a Last colonoscopy: n/a     07/15/2022    3:20 PM  Depression screen PHQ 2/9  Decreased Interest 0  Down, Depressed, Hopeless 0  PHQ - 2 Score 0  Altered sleeping 0  Tired, decreased energy 0  Change in appetite 0  Feeling bad or failure about yourself  0  Trouble concentrating 0  Moving slowly or fidgety/restless 0  Suicidal thoughts 0  PHQ-9 Score 0        07/15/2022    3:20 PM  GAD 7 : Generalized Anxiety Score  Nervous, Anxious, on Edge 0  Control/stop worrying 0  Worry too much - different things 0  Trouble relaxing 0  Restless 0  Easily annoyed or irritable 0  Afraid - awful might happen 0  Total GAD 7 Score 0     Review of Systems:   Pertinent items are noted in HPI Denies any headaches, blurred vision, fatigue, shortness of breath, chest pain, abdominal pain, abnormal vaginal discharge/itching/odor/irritation, problems with periods, bowel movements, urination, or intercourse unless otherwise stated above. Pertinent History Reviewed:  Reviewed past medical,surgical, social and family history.  Reviewed problem list, medications and allergies. Physical Assessment:   Vitals:   07/15/22 1501  BP: 119/80  Pulse: (!) 59  Weight: 217 lb 9.6 oz (98.7 kg)  Body mass index is 35.12 kg/m.         Physical Examination:   General appearance - well appearing, and in no distress  Mental status - alert, oriented to person, place, and time  Psych:  She has a normal mood and affect  Skin - warm and dry, normal color, no suspicious lesions noted  Chest - effort normal, all lung fields clear to auscultation bilaterally  Heart - normal rate and regular rhythm  Breasts - breasts appear normal, no suspicious masses, no skin or nipple changes or  axillary nodes  Abdomen - soft, nontender, nondistended, no masses or organomegaly  Pelvic -  VULVA: normal appearing vulva with no masses, tenderness or lesions   VAGINA: normal appearing vagina with normal color and discharge, no lesions   CERVIX: normal appearing cervix without discharge or lesions, no CMT  Thin prep pap is done with HR HPV cotesting  Extremities:  No swelling or varicosities noted  Chaperone present for exam  No results found for this or any previous visit (from the past 24 hour(s)).    Assessment & Plan:   1. Annual physical exam - Cervical cancer screening: Discussed screening Q3 years. Reviewed importance of annual exams and limits of pap smear. Pap with reflex HPV collected - GC/CT: Discussed and recommended. Pt  accepts - Birth Control:  none - Breast Health: Encouraged self breast awareness/exams.  - Follow-up: 12 months and prn  - Cytology - PAP - Cervicovaginal ancillary only  2. Screening for cervical cancer  Routine pap collected  Follow-up: Return for Annual GYN.  Lorriane Shire, MD 07/15/2022 3:03 PM

## 2022-07-16 LAB — CERVICOVAGINAL ANCILLARY ONLY
Bacterial Vaginitis (gardnerella): POSITIVE — AB
Candida Glabrata: NEGATIVE
Candida Vaginitis: NEGATIVE
Chlamydia: NEGATIVE
Comment: NEGATIVE
Comment: NEGATIVE
Comment: NEGATIVE
Comment: NEGATIVE
Comment: NEGATIVE
Comment: NORMAL
Neisseria Gonorrhea: NEGATIVE
Trichomonas: POSITIVE — AB

## 2022-07-16 LAB — CYTOLOGY - PAP: Diagnosis: NEGATIVE

## 2022-07-17 ENCOUNTER — Other Ambulatory Visit: Payer: Self-pay | Admitting: Obstetrics and Gynecology

## 2022-07-17 DIAGNOSIS — N760 Acute vaginitis: Secondary | ICD-10-CM

## 2022-07-17 DIAGNOSIS — A5901 Trichomonal vulvovaginitis: Secondary | ICD-10-CM

## 2022-07-17 MED ORDER — METRONIDAZOLE 500 MG PO TABS: 500.0000 mg | ORAL_TABLET | Freq: Two times a day (BID) | ORAL | 0 refills | Status: AC

## 2022-08-04 ENCOUNTER — Encounter: Payer: Self-pay | Admitting: Obstetrics and Gynecology

## 2022-08-12 ENCOUNTER — Encounter: Payer: Self-pay | Admitting: Obstetrics and Gynecology

## 2022-08-13 ENCOUNTER — Other Ambulatory Visit: Payer: Self-pay | Admitting: Lactation Services

## 2022-08-13 MED ORDER — LEVONORGESTREL 1.5 MG PO TABS: 1.5000 mg | ORAL_TABLET | Freq: Once | ORAL | 0 refills | Status: AC

## 2022-08-13 NOTE — Progress Notes (Signed)
Plan B sent to Pharmacy at Patients request for failed condom with intercourse.

## 2022-10-07 ENCOUNTER — Encounter: Payer: Self-pay | Admitting: Obstetrics and Gynecology

## 2022-10-07 DIAGNOSIS — B379 Candidiasis, unspecified: Secondary | ICD-10-CM

## 2022-10-07 MED ORDER — FLUCONAZOLE 150 MG PO TABS
150.0000 mg | ORAL_TABLET | Freq: Once | ORAL | 0 refills | Status: AC
Start: 2022-10-07 — End: 2022-10-07

## 2023-01-19 ENCOUNTER — Encounter: Payer: Self-pay | Admitting: Obstetrics and Gynecology

## 2023-01-20 ENCOUNTER — Other Ambulatory Visit (HOSPITAL_COMMUNITY)
Admission: RE | Admit: 2023-01-20 | Discharge: 2023-01-20 | Disposition: A | Discharge status: Home / Self Care | Payer: 59 | Source: Ambulatory Visit | Attending: Family Medicine | Admitting: Family Medicine

## 2023-01-20 ENCOUNTER — Ambulatory Visit: Payer: 59

## 2023-01-20 ENCOUNTER — Ambulatory Visit (INDEPENDENT_AMBULATORY_CARE_PROVIDER_SITE_OTHER): Payer: 59 | Admitting: General Practice

## 2023-01-20 ENCOUNTER — Other Ambulatory Visit: Payer: Self-pay

## 2023-01-20 ENCOUNTER — Encounter: Payer: Self-pay | Admitting: General Practice

## 2023-01-20 VITALS — BP 132/83 | HR 67 | Ht 65.0 in | Wt 220.0 lb

## 2023-01-20 DIAGNOSIS — N898 Other specified noninflammatory disorders of vagina: Secondary | ICD-10-CM | POA: Diagnosis not present

## 2023-01-20 DIAGNOSIS — B379 Candidiasis, unspecified: Secondary | ICD-10-CM

## 2023-01-20 DIAGNOSIS — Z8619 Personal history of other infectious and parasitic diseases: Secondary | ICD-10-CM | POA: Insufficient documentation

## 2023-01-20 DIAGNOSIS — T3695XA Adverse effect of unspecified systemic antibiotic, initial encounter: Secondary | ICD-10-CM

## 2023-01-20 MED ORDER — FLUCONAZOLE 150 MG PO TABS
150.0000 mg | ORAL_TABLET | Freq: Once | ORAL | 0 refills | Status: AC
Start: 2023-01-20 — End: 2023-01-20

## 2023-01-20 MED ORDER — METRONIDAZOLE 0.75 % EX GEL
1.0000 | Freq: Every day | CUTANEOUS | 0 refills | Status: DC
Start: 2023-01-20 — End: 2023-01-21

## 2023-01-20 NOTE — Progress Notes (Signed)
Patient presents to office today reporting vaginal discharge with odor for past week- no itching. She has been spotting for the past couple of weeks as well- at home pregnancy tests have been negative. She has a history of frequent BV infections and never finished her medicine several months ago. Reports flagyl makes her sick and she can never keep it down no matter what she does. Metrogel sent to pharmacy per protocol as well as Diflucan as she will get yeast infections with antibiotics. Discussed self swab today for STI screening as well due to spotting and hx of trichomonas. Patient was instructed in self swab & specimen collected. Advised results should be back in 24-48 hours and available via mychart.   Chase Caller RN BSN 01/20/23

## 2023-01-20 NOTE — Progress Notes (Signed)
Attestation of visit: Intake and evaluation was performed by the nurse.  I have reviewed the note and the chart, and I agree with the management and plan.   Albertine Grates, FNP [facility] [employer], [facility]

## 2023-01-21 ENCOUNTER — Other Ambulatory Visit: Payer: Self-pay

## 2023-01-21 DIAGNOSIS — N898 Other specified noninflammatory disorders of vagina: Secondary | ICD-10-CM

## 2023-01-21 LAB — CERVICOVAGINAL ANCILLARY ONLY
Bacterial Vaginitis (gardnerella): POSITIVE — AB
Candida Glabrata: NEGATIVE
Candida Vaginitis: NEGATIVE
Chlamydia: POSITIVE — AB
Comment: NEGATIVE
Comment: NEGATIVE
Comment: NEGATIVE
Comment: NEGATIVE
Comment: NEGATIVE
Comment: NORMAL
Neisseria Gonorrhea: NEGATIVE
Trichomonas: NEGATIVE

## 2023-01-21 MED ORDER — METRONIDAZOLE 0.75 % EX GEL
1.0000 | Freq: Every day | CUTANEOUS | 0 refills | Status: DC
Start: 2023-01-21 — End: 2023-04-15

## 2023-01-22 ENCOUNTER — Telehealth: Payer: Self-pay | Admitting: Lactation Services

## 2023-01-22 ENCOUNTER — Other Ambulatory Visit: Payer: Self-pay | Admitting: Obstetrics and Gynecology

## 2023-01-22 DIAGNOSIS — A749 Chlamydial infection, unspecified: Secondary | ICD-10-CM

## 2023-01-22 MED ORDER — DOXYCYCLINE HYCLATE 100 MG PO CAPS
100.0000 mg | ORAL_CAPSULE | Freq: Two times a day (BID) | ORAL | 0 refills | Status: AC
Start: 2023-01-22 — End: 2023-01-29

## 2023-01-22 NOTE — Telephone Encounter (Signed)
Called patient with results of Vaginal swab. She did not answer. LM for her to call the office for results and recommendations and to also check her Mychart message from the provider.   STD report completed and faxed to [facility]. Fax confirmation received.

## 2023-01-22 NOTE — Telephone Encounter (Signed)
-----   Message from [facility] sent at 01/22/2023  8:41 AM EST ----- Chlamydia , rx sent and notified through mychart,  will need TOC in 3 months, can we get her scheduled!

## 2023-01-23 NOTE — Telephone Encounter (Signed)
Attempted to call patient again, she did not answer.  She has read her Mychart message from provider since yesterday.

## 2023-02-28 ENCOUNTER — Encounter: Payer: Self-pay | Admitting: Obstetrics and Gynecology

## 2023-03-31 ENCOUNTER — Ambulatory Visit: Payer: 59

## 2023-04-09 ENCOUNTER — Other Ambulatory Visit: Payer: Self-pay | Admitting: Lactation Services

## 2023-04-09 MED ORDER — LEVONORGESTREL 1.5 MG PO TABS
1.5000 mg | ORAL_TABLET | Freq: Once | ORAL | Status: AC
Start: 2023-04-09 — End: ?

## 2023-04-09 MED ORDER — LEVONORGESTREL 1.5 MG PO TABS: 1.5000 mg | ORAL_TABLET | Freq: Once | ORAL | 0 refills | Status: AC

## 2023-04-09 NOTE — Progress Notes (Signed)
Patient requested RX for Plan B via Pharmacy,  sent to Pharmacy per patient request.

## 2023-04-15 ENCOUNTER — Other Ambulatory Visit (HOSPITAL_COMMUNITY)
Admission: RE | Admit: 2023-04-15 | Discharge: 2023-04-15 | Disposition: A | Discharge status: Home / Self Care | Payer: 59 | Source: Ambulatory Visit | Attending: Family Medicine | Admitting: Family Medicine

## 2023-04-15 ENCOUNTER — Ambulatory Visit (INDEPENDENT_AMBULATORY_CARE_PROVIDER_SITE_OTHER): Payer: 59 | Admitting: *Deleted

## 2023-04-15 VITALS — BP 120/74 | HR 64 | Ht 65.5 in | Wt 218.3 lb

## 2023-04-15 DIAGNOSIS — N760 Acute vaginitis: Secondary | ICD-10-CM | POA: Insufficient documentation

## 2023-04-15 DIAGNOSIS — B9689 Other specified bacterial agents as the cause of diseases classified elsewhere: Secondary | ICD-10-CM | POA: Diagnosis not present

## 2023-04-15 DIAGNOSIS — Z113 Encounter for screening for infections with a predominantly sexual mode of transmission: Secondary | ICD-10-CM

## 2023-04-15 NOTE — Progress Notes (Addendum)
Pt presents with request for self swab due to she recently had protected anal and vaginal sex but her rectal area has been hurting @ anus.  She denies bleeding or painful bowel movements. She also denies vaginal sx. Pt desires to be tested for STI. Self swab obtained of vagina and anus. She will be notified of test results and treatment indicated if any is needed via Mychart. Pt was advised to abstain from anal sex until all pain and discomfort of the area is gone. If the pain persists more than one month or worsens, she was advised to seek medical care from her PCP. She voiced understanding.

## 2023-04-17 LAB — CERVICOVAGINAL ANCILLARY ONLY
Bacterial Vaginitis (gardnerella): POSITIVE — AB
Candida Glabrata: NEGATIVE
Candida Vaginitis: NEGATIVE
Chlamydia: NEGATIVE
Comment: NEGATIVE
Comment: NEGATIVE
Comment: NEGATIVE
Comment: NEGATIVE
Comment: NEGATIVE
Comment: NORMAL
Neisseria Gonorrhea: NEGATIVE
Trichomonas: NEGATIVE

## 2023-04-28 ENCOUNTER — Encounter: Payer: Self-pay | Admitting: Family Medicine

## 2023-04-28 DIAGNOSIS — B379 Candidiasis, unspecified: Secondary | ICD-10-CM

## 2023-04-28 DIAGNOSIS — N760 Acute vaginitis: Secondary | ICD-10-CM

## 2023-04-29 MED ORDER — METRONIDAZOLE 0.75 % VA GEL: 1.0000 | Freq: Every day | VAGINAL | 5 refills | Status: DC

## 2023-04-29 NOTE — Telephone Encounter (Signed)
Patient is requesting metrogel instead of metronidazole, please advise

## 2023-06-08 MED ORDER — FLUCONAZOLE 150 MG PO TABS: 150.0000 mg | ORAL_TABLET | Freq: Once | ORAL | 3 refills | Status: AC

## 2023-06-08 NOTE — Addendum Note (Signed)
 Addended by: Geanie Berlin on: 06/08/2023 08:33 AM   Modules accepted: Orders

## 2023-08-13 ENCOUNTER — Ambulatory Visit (HOSPITAL_COMMUNITY)

## 2023-09-28 ENCOUNTER — Ambulatory Visit

## 2023-09-29 ENCOUNTER — Other Ambulatory Visit: Payer: Self-pay | Admitting: Obstetrics and Gynecology

## 2023-09-29 DIAGNOSIS — N898 Other specified noninflammatory disorders of vagina: Secondary | ICD-10-CM

## 2023-10-02 ENCOUNTER — Ambulatory Visit

## 2023-10-06 ENCOUNTER — Ambulatory Visit

## 2023-10-07 ENCOUNTER — Ambulatory Visit

## 2023-11-11 ENCOUNTER — Ambulatory Visit

## 2023-12-23 ENCOUNTER — Encounter: Payer: Self-pay | Admitting: Obstetrics and Gynecology

## 2023-12-24 ENCOUNTER — Other Ambulatory Visit: Payer: Self-pay | Admitting: *Deleted

## 2023-12-24 MED ORDER — FLUCONAZOLE 150 MG PO TABS
150.0000 mg | ORAL_TABLET | Freq: Once | ORAL | 0 refills | Status: AC
Start: 2023-12-24 — End: 2023-12-24

## 2024-03-24 ENCOUNTER — Ambulatory Visit (HOSPITAL_COMMUNITY)
Admission: EM | Admit: 2024-03-24 | Discharge: 2024-03-24 | Disposition: A | Discharge status: Home / Self Care | Attending: Family Medicine | Admitting: Family Medicine

## 2024-03-24 ENCOUNTER — Encounter (HOSPITAL_COMMUNITY): Payer: Self-pay

## 2024-03-24 DIAGNOSIS — J069 Acute upper respiratory infection, unspecified: Secondary | ICD-10-CM | POA: Diagnosis not present

## 2024-03-24 LAB — POC SOFIA SARS ANTIGEN FIA: SARS Coronavirus 2 Ag: NEGATIVE

## 2024-03-24 NOTE — ED Provider Notes (Signed)
 " [facility]    CSN: [id number] Arrival date & time: 03/24/24  1732      History   Chief Complaint Chief Complaint  Patient presents with   Letter for School/Work    Covid test    HPI Teresa Boyle is a 29 y.o. female.   HPI Here for testing for COVID  About 7 days ago she came down with upper respiratory symptoms.  She feels much better now and is not having any cough or congestion or fever.  Her work is requiring her to have a COVID test.  She has not done any testing while she was ill.  She is allergic to metronidazole   Last menstrual cycle began January 4 Past Medical History:  Diagnosis Date   Medical history non-contributory     There are no active problems to display for this patient.   Past Surgical History:  Procedure Laterality Date   NO PAST SURGERIES      OB History     Gravida  1   Para      Term      Preterm      AB      Living         SAB      IAB      Ectopic      Multiple      Live Births               Home Medications    Prior to Admission medications  Not on File    Family History Family History  Problem Relation Age of Onset   Diabetes Father    Diabetes Paternal Uncle    Diabetes Paternal Grandmother    Diabetes Paternal Grandfather     Social History Social History[1]   Allergies   Metronidazole    Review of Systems Review of Systems   Physical Exam Triage Vital Signs ED Triage Vitals [03/24/24 1901]  Encounter Vitals Group     BP 113/73     Girls Systolic BP Percentile      Girls Diastolic BP Percentile      Boys Systolic BP Percentile      Boys Diastolic BP Percentile      Pulse Rate 69     Resp 16     Temp 99 F (37.2 C)     Temp Source Oral     SpO2 97 %     Weight      Height      Head Circumference      Peak Flow      Pain Score 0     Pain Loc      Pain Education      Exclude from Growth Chart    No data found.  Updated Vital Signs BP 113/73 (BP  Location: Right Arm)   Pulse 69   Temp 99 F (37.2 C) (Oral)   Resp 16   LMP 03/20/2024 (Approximate)   SpO2 97%   Visual Acuity Right Eye Distance:   Left Eye Distance:   Bilateral Distance:    Right Eye Near:   Left Eye Near:    Bilateral Near:     Physical Exam Vitals reviewed.  Constitutional:      General: She is not in acute distress.    Appearance: She is not ill-appearing, toxic-appearing or diaphoretic.  HENT:     Nose: Nose normal.     Mouth/Throat:     Mouth: Mucous membranes are  moist.  Cardiovascular:     Rate and Rhythm: Normal rate and regular rhythm.     Heart sounds: No murmur heard. Pulmonary:     Effort: Pulmonary effort is normal.     Breath sounds: Normal breath sounds.  Skin:    Coloration: Skin is not pale.  Neurological:     Mental Status: She is alert and oriented to person, place, and time.  Psychiatric:        Behavior: Behavior normal.      UC Treatments / Results  Labs (all labs ordered are listed, but only abnormal results are displayed) Labs Reviewed  POC SOFIA SARS ANTIGEN FIA    EKG   Radiology No results found.  Procedures Procedures (including critical care time)  Medications Ordered in UC Medications - No data to display  Initial Impression / Assessment and Plan / UC Course  I have reviewed the triage vital signs and the nursing notes.  Pertinent labs & imaging results that were available during my care of the patient were reviewed by me and considered in my medical decision making (see chart for details).     COVID test is negative. Her note is provided; her next scheduled day to work is January 12  Final diagnoses:  Acute upper respiratory infection     Discharge Instructions      Your COVID test is negative     ED Prescriptions   None    PDMP not reviewed this encounter.     [1]  Social History Tobacco Use   Smoking status: Former    Types: Cigarettes   Smokeless tobacco: Never   Vaping Use   Vaping status: Never Used  Substance Use Topics   Alcohol use: Yes    Comment: on occ   Drug use: Never     Vonna Sharlet POUR, MD 03/24/24 1933  "

## 2024-03-24 NOTE — ED Triage Notes (Signed)
 Patient here today to be tested for Covid so that she can return to work. Denies symptoms.

## 2024-03-24 NOTE — Discharge Instructions (Signed)
 Your COVID test is negative.
